# Patient Record
Sex: Male | Born: 1985 | Race: Black or African American | Hispanic: No | Smoking: Never smoker
Health system: Southern US, Community
[De-identification: ages and names within clinical notes are randomized; demographics above are authoritative.]

## PROBLEM LIST (undated history)

## (undated) DIAGNOSIS — J45909 Unspecified asthma, uncomplicated: Secondary | ICD-10-CM

## (undated) DIAGNOSIS — T7840XA Allergy, unspecified, initial encounter: Secondary | ICD-10-CM

## (undated) HISTORY — PX: HERNIA REPAIR: SHX51

## (undated) HISTORY — PX: EYE SURGERY: SHX253

## (undated) HISTORY — DX: Allergy, unspecified, initial encounter: T78.40XA

---
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
## 2003-09-21 ENCOUNTER — Emergency Department (HOSPITAL_COMMUNITY): Admission: EM | Admit: 2003-09-21 | Discharge: 2003-09-21 | Payer: Self-pay | Admitting: Emergency Medicine

## 2003-12-01 ENCOUNTER — Emergency Department (HOSPITAL_COMMUNITY): Admission: EM | Admit: 2003-12-01 | Discharge: 2003-12-01 | Payer: Self-pay

## 2005-12-13 ENCOUNTER — Emergency Department (HOSPITAL_COMMUNITY): Admission: EM | Admit: 2005-12-13 | Discharge: 2005-12-13 | Payer: Self-pay | Admitting: Emergency Medicine

## 2005-12-14 ENCOUNTER — Emergency Department (HOSPITAL_COMMUNITY): Admission: EM | Admit: 2005-12-14 | Discharge: 2005-12-14 | Payer: Self-pay | Admitting: Emergency Medicine

## 2009-12-15 ENCOUNTER — Emergency Department (HOSPITAL_COMMUNITY): Admission: EM | Admit: 2009-12-15 | Discharge: 2009-12-15 | Payer: Self-pay | Admitting: Family Medicine

## 2010-10-16 ENCOUNTER — Emergency Department (HOSPITAL_COMMUNITY)
Admission: EM | Admit: 2010-10-16 | Discharge: 2010-10-16 | Disposition: A | Discharge status: Home / Self Care | Payer: Self-pay | Attending: Emergency Medicine | Admitting: Emergency Medicine

## 2010-10-16 DIAGNOSIS — R112 Nausea with vomiting, unspecified: Secondary | ICD-10-CM | POA: Insufficient documentation

## 2010-10-16 DIAGNOSIS — R109 Unspecified abdominal pain: Secondary | ICD-10-CM | POA: Insufficient documentation

## 2010-10-16 DIAGNOSIS — R197 Diarrhea, unspecified: Secondary | ICD-10-CM | POA: Insufficient documentation

## 2010-10-16 DIAGNOSIS — R509 Fever, unspecified: Secondary | ICD-10-CM | POA: Insufficient documentation

## 2010-10-16 DIAGNOSIS — R61 Generalized hyperhidrosis: Secondary | ICD-10-CM | POA: Insufficient documentation

## 2010-11-18 LAB — GC/CHLAMYDIA PROBE AMP, GENITAL: Chlamydia, DNA Probe: NEGATIVE

## 2010-12-28 ENCOUNTER — Emergency Department (HOSPITAL_COMMUNITY)
Admission: EM | Admit: 2010-12-28 | Discharge: 2010-12-28 | Disposition: A | Discharge status: Home / Self Care | Payer: Self-pay | Attending: Emergency Medicine | Admitting: Emergency Medicine

## 2010-12-28 ENCOUNTER — Emergency Department (HOSPITAL_COMMUNITY): Payer: Self-pay

## 2010-12-28 DIAGNOSIS — M25559 Pain in unspecified hip: Secondary | ICD-10-CM | POA: Insufficient documentation

## 2010-12-28 DIAGNOSIS — M545 Low back pain, unspecified: Secondary | ICD-10-CM | POA: Insufficient documentation

## 2010-12-31 ENCOUNTER — Emergency Department (HOSPITAL_COMMUNITY)
Admission: EM | Admit: 2010-12-31 | Discharge: 2010-12-31 | Disposition: A | Discharge status: Home / Self Care | Payer: Self-pay | Attending: Emergency Medicine | Admitting: Emergency Medicine

## 2010-12-31 DIAGNOSIS — IMO0001 Reserved for inherently not codable concepts without codable children: Secondary | ICD-10-CM | POA: Insufficient documentation

## 2010-12-31 DIAGNOSIS — Z79899 Other long term (current) drug therapy: Secondary | ICD-10-CM | POA: Insufficient documentation

## 2010-12-31 DIAGNOSIS — M25559 Pain in unspecified hip: Secondary | ICD-10-CM | POA: Insufficient documentation

## 2010-12-31 DIAGNOSIS — M545 Low back pain, unspecified: Secondary | ICD-10-CM | POA: Insufficient documentation

## 2012-09-29 ENCOUNTER — Encounter (HOSPITAL_COMMUNITY): Payer: Self-pay | Admitting: *Deleted

## 2012-09-29 ENCOUNTER — Emergency Department (HOSPITAL_COMMUNITY)
Admission: EM | Admit: 2012-09-29 | Discharge: 2012-09-29 | Disposition: A | Discharge status: Home / Self Care | Payer: Self-pay | Attending: Emergency Medicine | Admitting: Emergency Medicine

## 2012-09-29 DIAGNOSIS — F1210 Cannabis abuse, uncomplicated: Secondary | ICD-10-CM | POA: Insufficient documentation

## 2012-09-29 DIAGNOSIS — J45909 Unspecified asthma, uncomplicated: Secondary | ICD-10-CM | POA: Insufficient documentation

## 2012-09-29 DIAGNOSIS — IMO0001 Reserved for inherently not codable concepts without codable children: Secondary | ICD-10-CM

## 2012-09-29 DIAGNOSIS — L03019 Cellulitis of unspecified finger: Secondary | ICD-10-CM | POA: Insufficient documentation

## 2012-09-29 HISTORY — DX: Unspecified asthma, uncomplicated: J45.909

## 2012-09-29 NOTE — ED Notes (Signed)
Pt reports swelling and tenderness in left ring finger at nail base. No known injury. No open sore to finger.

## 2012-09-29 NOTE — ED Provider Notes (Signed)
History     CSN: [id number]  Arrival date & time 09/29/12  7846   First MD Initiated Contact with Patient 09/29/12 [phone]      Chief Complaint  Patient presents with  . Finger Injury    (Consider location/radiation/quality/duration/timing/severity/associated sxs/prior treatment) Patient is a 27 y.o. male presenting with hand pain. The history is provided by the patient.  Hand Pain This is a new problem. The current episode started in the past 7 days. The problem occurs constantly. The problem has been gradually improving. Pertinent negatives include no abdominal pain, arthralgias, chest pain, chills, congestion, coughing, fatigue, fever, headaches, nausea, rash, vomiting or weakness. Exacerbated by: palpation. He has tried nothing for the symptoms. The treatment provided no relief.    Past Medical History  Diagnosis Date  . Asthma     Past Surgical History  Procedure Date  . Eye surgery     No family history on file.  History  Substance Use Topics  . Smoking status: Never Smoker   . Smokeless tobacco: Not on file  . Alcohol Use: Yes      Review of Systems  Constitutional: Negative for fever, chills and fatigue.  HENT: Negative for congestion, rhinorrhea and postnasal drip.   Eyes: Negative for photophobia and visual disturbance.  Respiratory: Negative for cough, chest tightness, shortness of breath and wheezing.   Cardiovascular: Negative for chest pain, palpitations and leg swelling.  Gastrointestinal: Negative for nausea, vomiting, abdominal pain and diarrhea.  Genitourinary: Negative for urgency, frequency and difficulty urinating.  Musculoskeletal: Negative for back pain and arthralgias.       Finger pain  Skin: Negative for rash and wound.  Neurological: Negative for weakness and headaches.  Psychiatric/Behavioral: Negative for confusion and agitation.    Allergies  Review of patient's allergies indicates no known allergies.  Home Medications   Current  Outpatient Rx  Name  Route  Sig  Dispense  Refill  . BENADRYL PO   Oral   Take 15 mLs by mouth 4 (four) times daily as needed. allergies         . IBUPROFEN 200 MG PO TABS   Oral   Take 400 mg by mouth every 6 (six) hours as needed. For pain           BP 133/80  Pulse 105  Temp 99.3 F (37.4 C) (Oral)  Resp 16  SpO2 99%  Physical Exam  Nursing note and vitals reviewed. Constitutional: He is oriented to person, place, and time. He appears well-developed and well-nourished. No distress.  HENT:  Head: Normocephalic and atraumatic.  Mouth/Throat: Oropharynx is clear and moist.  Eyes: EOM are normal. Pupils are equal, round, and reactive to light.  Neck: Normal range of motion. Neck supple.  Cardiovascular: Normal rate, regular rhythm, normal heart sounds and intact distal pulses.   Pulmonary/Chest: Effort normal and breath sounds normal. He has no wheezes. He has no rales.  Abdominal: Soft. Bowel sounds are normal. He exhibits no distension. There is no tenderness. There is no rebound and no guarding.  Musculoskeletal: Normal range of motion. He exhibits edema. He exhibits no tenderness.       Paronychia to left 4th finger. No pain with flexion or extension of finger. No sausage digit.  Lymphadenopathy:    He has no cervical adenopathy.  Neurological: He is alert and oriented to person, place, and time. He displays normal reflexes. No cranial nerve deficit. He exhibits normal muscle tone. Coordination normal.  Skin: Skin is  warm and dry. No rash noted.  Psychiatric: He has a normal mood and affect. His behavior is normal.    ED Course  Drain paronychia Date/Time: 09/29/2012 9:35 AM Performed by: Johnnette Gourd Authorized by: Lorre Nick T Consent: Verbal consent obtained. Patient understanding: patient states understanding of the procedure being performed Patient consent: the patient's understanding of the procedure matches consent given Site marked: the operative site  was marked Patient identity confirmed: verbally with patient Preparation: Patient was prepped and draped in the usual sterile fashion. Local anesthesia used: no Patient sedated: no Patient tolerance: Patient tolerated the procedure well with no immediate complications. Comments: Small amount of purulent fluid drained.   (including critical care time)  Labs Reviewed - No data to display No results found.   1. Paronychia of fourth finger of left hand       MDM  42M with left 4th finger paronychia he noticed a couple days ago. No injury. No fevers or signs of systemic illness. No concern for flexor tenosynovitis, osteomyelitis, of other deep tissue infection. Paronychia drained without complications. Stable for d/c home. Given return precautions.        Johnnette Gourd, MD 09/29/12 1101

## 2012-10-01 NOTE — ED Provider Notes (Signed)
I saw and evaluated the patient, reviewed the resident's note and I agree with the findings and plan.  Toy Baker, MD 10/01/12 1101

## 2012-10-02 ENCOUNTER — Emergency Department (HOSPITAL_COMMUNITY)
Admission: EM | Admit: 2012-10-02 | Discharge: 2012-10-02 | Disposition: A | Discharge status: Home / Self Care | Payer: Self-pay | Attending: Emergency Medicine | Admitting: Emergency Medicine

## 2012-10-02 ENCOUNTER — Encounter (HOSPITAL_COMMUNITY): Payer: Self-pay | Admitting: Physical Medicine and Rehabilitation

## 2012-10-02 DIAGNOSIS — R5381 Other malaise: Secondary | ICD-10-CM | POA: Insufficient documentation

## 2012-10-02 DIAGNOSIS — R112 Nausea with vomiting, unspecified: Secondary | ICD-10-CM | POA: Insufficient documentation

## 2012-10-02 DIAGNOSIS — R1110 Vomiting, unspecified: Secondary | ICD-10-CM

## 2012-10-02 DIAGNOSIS — R5383 Other fatigue: Secondary | ICD-10-CM | POA: Insufficient documentation

## 2012-10-02 DIAGNOSIS — J45909 Unspecified asthma, uncomplicated: Secondary | ICD-10-CM | POA: Insufficient documentation

## 2012-10-02 DIAGNOSIS — F1010 Alcohol abuse, uncomplicated: Secondary | ICD-10-CM | POA: Insufficient documentation

## 2012-10-02 LAB — URINALYSIS, ROUTINE W REFLEX MICROSCOPIC
Bilirubin Urine: NEGATIVE
Glucose, UA: NEGATIVE mg/dL
Hgb urine dipstick: NEGATIVE
Ketones, ur: NEGATIVE mg/dL
Leukocytes, UA: NEGATIVE
Nitrite: NEGATIVE
Protein, ur: NEGATIVE mg/dL
Specific Gravity, Urine: 1.023 (ref 1.005–1.030)
Urobilinogen, UA: 1 mg/dL (ref 0.0–1.0)
pH: 7.5 (ref 5.0–8.0)

## 2012-10-02 MED ORDER — ONDANSETRON 4 MG PO TBDP
4.0000 mg | ORAL_TABLET | Freq: Once | ORAL | Status: AC
  Administered 2012-10-02: 4 mg via ORAL
  Filled 2012-10-02: qty 1

## 2012-10-02 NOTE — ED Notes (Signed)
Pt able to keep fluids down; pt denies nausea/vomiting. Pt denies pain. Pt alert and mentating appropriately.

## 2012-10-02 NOTE — ED Notes (Signed)
Pt presents to department for evaluation of nausea and vomiting. Onset this morning. States he woke up and began vomiting. Denies abdominal pain. Admits to drinking ETOH last night. Also states generalized weakness. Denies fever. He is alert and oriented x4.

## 2012-10-02 NOTE — ED Notes (Signed)
Pt states he feels weak all over; pt denies pain; pt states he thinks there is blood in his vomit; pt states he has been unable to keep fluids down; pt denies numbness/tingling; pt denies headache; pt alert and mentating appropriately.

## 2012-10-02 NOTE — ED Notes (Addendum)
Family at bedside. 

## 2012-10-02 NOTE — ED Notes (Signed)
Pt given water and ginger ale in attempt to fluid challenge per B. Reola Calkins, PA order

## 2012-10-02 NOTE — ED Notes (Signed)
Pt ambulatory leaving ED with d/c paperwork. Pt given d/c teaching. Pt educated on need for follow-up care and shown resource guide to how to find PCP. Pt verbalized understanding of d/c teaching. Pt has no further questions upon d/c. Pt does not show signs of acute distress upon d/c.

## 2012-10-03 NOTE — ED Provider Notes (Signed)
History     CSN: [id number]  Arrival date & time 10/02/12  1148   First MD Initiated Contact with Patient 10/02/12 1336      Chief Complaint  Patient presents with  . Nausea  . Emesis    (Consider location/radiation/quality/duration/timing/severity/associated sxs/prior treatment) HPI Comments: 1/ y.o. male presents today complaining of acute onset nausea and vomiting x5 times since this morning. Pt woke up and vomited shortly there after (food contents, not sure if he saw blood or not, no bilie). Pt states vomiting was severe and concerning to him. Pt took no interventions. Nothing makes it better or worse. Pt admits ETOH intake last night (vodka, two drinks, and eating chicken wings at a restaurant). Denies abdominal pain, diarrhea, chest pain, palpitations, shortness of breath, generalized weakness, numbness, tingling, headache, visual disturbances. PT A&O x4, No significant PMHx,      Patient is a 27 y.o. male presenting with vomiting.  Emesis  Pertinent negatives include no diarrhea, no fever and no headaches.    Past Medical History  Diagnosis Date  . Asthma     Past Surgical History  Procedure Date  . Eye surgery     History reviewed. No pertinent family history.  History  Substance Use Topics  . Smoking status: Never Smoker   . Smokeless tobacco: Not on file  . Alcohol Use: Yes      Review of Systems  Constitutional: Negative for fever and diaphoresis.  HENT: Negative for neck pain and neck stiffness.   Eyes: Negative for visual disturbance.  Respiratory: Negative for apnea, chest tightness and shortness of breath.   Cardiovascular: Negative for chest pain and palpitations.  Gastrointestinal: Positive for nausea and vomiting. Negative for diarrhea and constipation.  Genitourinary: Negative for dysuria.  Musculoskeletal: Negative for gait problem.  Skin: Negative for rash.  Neurological: Negative for dizziness, weakness, light-headedness, numbness and  headaches.    Allergies  Review of patient's allergies indicates no known allergies.  Home Medications   Current Outpatient Rx  Name  Route  Sig  Dispense  Refill  . BENADRYL PO   Oral   Take 15 mLs by mouth 4 (four) times daily as needed. allergies         . IBUPROFEN 200 MG PO TABS   Oral   Take 400 mg by mouth every 6 (six) hours as needed. For pain           BP 119/74  Pulse 66  Temp 98.3 F (36.8 C) (Oral)  Resp 16  SpO2 97%  Physical Exam  Nursing note and vitals reviewed. Constitutional: He is oriented to person, place, and time. He appears well-developed and well-nourished. No distress.  HENT:  Head: Normocephalic and atraumatic.  Eyes: EOM are normal. Pupils are equal, round, and reactive to light.  Neck: Normal range of motion. Neck supple.       No meningeal signs  Cardiovascular: Normal rate, regular rhythm and normal heart sounds.  Exam reveals no gallop and no friction rub.   No murmur heard. Pulmonary/Chest: Effort normal and breath sounds normal. No respiratory distress. He has no wheezes. He has no rales. He exhibits no tenderness.  Abdominal: Soft. Bowel sounds are normal. He exhibits no distension. There is no tenderness. There is no rebound and no guarding.  Musculoskeletal: Normal range of motion. He exhibits no edema and no tenderness.       Good grip strength. Muscle strength 5/5 throughout.   Neurological: He is alert and oriented  to person, place, and time. No cranial nerve deficit.       Sensation to light touch intact. No focal deficits. Point to point coordination in tact.   Skin: Skin is warm and dry. He is not diaphoretic. No erythema.    ED Course  Procedures (including critical care time)   Labs Reviewed  URINALYSIS, ROUTINE W REFLEX MICROSCOPIC  LAB REPORT - SCANNED   No results found.  Results for orders placed during the hospital encounter of 10/02/12  URINALYSIS, ROUTINE W REFLEX MICROSCOPIC      Component Value Range    Color, Urine YELLOW  YELLOW   APPearance CLEAR  CLEAR   Specific Gravity, Urine 1.023  1.005 - 1.030   pH 7.5  5.0 - 8.0   Glucose, UA NEGATIVE  NEGATIVE mg/dL   Hgb urine dipstick NEGATIVE  NEGATIVE   Bilirubin Urine NEGATIVE  NEGATIVE   Ketones, ur NEGATIVE  NEGATIVE mg/dL   Protein, ur NEGATIVE  NEGATIVE mg/dL   Urobilinogen, UA 1.0  0.0 - 1.0 mg/dL   Nitrite NEGATIVE  NEGATIVE   Leukocytes, UA NEGATIVE  NEGATIVE   1. Vomiting       MDM  Pt well-appearing and non-emetic during exam. Alert and answering questions appropriately. Family member at bedside. Treated nausea with Zofran. Will monitor vitals, fluid challenge, and re-assess. Urinalysis unremarkable.  Successfully completed fluid challenge. No muscular/neuro deficits on physical exam. Vitals stable.  At this time there does not appear to be any evidence of an acute emergency medical condition and the patient appears stable for discharge with appropriate outpatient follow up. Discussed importance of establishing relationship with PCP, the availability of [facility] facility, and provided resource guide. Diagnosis was discussed with patient who verbalizes understanding and is agreeable to discharge. Pt also seen by Dr. Oletta Lamas who agrees with the plan.  Glade Nurse, PA-C 10/03/12 1106

## 2012-10-05 NOTE — ED Provider Notes (Signed)
Medical screening examination/treatment/procedure(s) were conducted as a shared visit with non-physician practitioner(s) and myself.  I personally evaluated the patient during the encounter  Pt with soft abdomen, improved after antiemetics.  Not dehydrated.  Likely self limiting gastroenteritis like illness.     Gavin Pound. Ashtian Villacis, MD 10/05/12 1356

## 2013-05-04 ENCOUNTER — Emergency Department (HOSPITAL_COMMUNITY)
Admission: EM | Admit: 2013-05-04 | Discharge: 2013-05-04 | Disposition: A | Discharge status: Home / Self Care | Payer: Self-pay | Attending: Emergency Medicine | Admitting: Emergency Medicine

## 2013-05-04 ENCOUNTER — Encounter (HOSPITAL_COMMUNITY): Payer: Self-pay | Admitting: Emergency Medicine

## 2013-05-04 DIAGNOSIS — K029 Dental caries, unspecified: Secondary | ICD-10-CM | POA: Insufficient documentation

## 2013-05-04 DIAGNOSIS — J45909 Unspecified asthma, uncomplicated: Secondary | ICD-10-CM | POA: Insufficient documentation

## 2013-05-04 MED ORDER — IBUPROFEN 800 MG PO TABS: 800.0000 mg | ORAL_TABLET | Freq: Three times a day (TID) | ORAL | Status: DC | PRN

## 2013-05-04 MED ORDER — HYDROCODONE-ACETAMINOPHEN 5-325 MG PO TABS: 1.0000 | ORAL_TABLET | Freq: Four times a day (QID) | ORAL | Status: DC | PRN

## 2013-05-04 NOTE — ED Provider Notes (Signed)
CSN: [id number]     Arrival date & time 05/04/13  3086 History   First MD Initiated Contact with Patient 05/04/13 [phone]     Chief Complaint  Patient presents with  . Dental Pain   (Consider location/radiation/quality/duration/timing/severity/associated sxs/prior Treatment) HPI Patient presents emergency department with dental pain that began last Friday.  Patient, states, that he has a crack, and a chip in that tooth in the left lower back tooth.  Patient, states, that he has not have any swelling or pain in the gumline.  The patient denies shortness of breath, difficulty swallowing, difficulty breathing, neck pain, neck swelling, fever, or hoarseness.  Patient, states he did not take any medications prior to arrival.  Patient, states, that nothing seems to make his condition, better, but palpation and air, make this tooth pain worse. Past Medical History  Diagnosis Date  . Asthma    Past Surgical History  Procedure Laterality Date  . Eye surgery     No family history on file. History  Substance Use Topics  . Smoking status: Never Smoker   . Smokeless tobacco: Not on file  . Alcohol Use: Yes    Review of Systems All other systems negative except as documented in the HPI. All pertinent positives and negatives as reviewed in the HPI. Allergies  Review of patient's allergies indicates no known allergies.  Home Medications   Current Outpatient Rx  Name  Route  Sig  Dispense  Refill  . acetaminophen (TYLENOL) 500 MG tablet   Oral   Take 1,000 mg by mouth every 6 (six) hours as needed for pain (pain).         Marland Kitchen amoxicillin (AMOXIL) 500 MG tablet   Oral   Take 500 mg by mouth 2 (two) times daily. Pt started on 05-04-13. This rx was from a friend's old rx. Pt has only taken 1 dose.         . ibuprofen (ADVIL,MOTRIN) 200 MG tablet   Oral   Take 400 mg by mouth every 6 (six) hours as needed. For pain          BP 109/60  Pulse 60  Temp(Src) 97.3 F (36.3 C) (Oral)  Resp 16   SpO2 97% Physical Exam  Constitutional: He appears well-developed and well-nourished. No distress.  HENT:  Head: Normocephalic and atraumatic.  Mouth/Throat: Uvula is midline, oropharynx is clear and moist and mucous membranes are normal. Abnormal dentition. Dental caries present. No dental abscesses or edematous.    Eyes: Pupils are equal, round, and reactive to light.  Cardiovascular: Normal rate, regular rhythm and normal heart sounds.  Exam reveals no gallop and no friction rub.   No murmur heard. Pulmonary/Chest: Effort normal and breath sounds normal. No respiratory distress.    ED Course  Procedures (including critical care time)   The patient will be referred to dentistry. Told to return here as needed.    Carlyle Dolly, PA-C 05/04/13 1019

## 2013-05-04 NOTE — ED Provider Notes (Signed)
Medical screening examination/treatment/procedure(s) were performed by non-physician practitioner and as supervising physician I was immediately available for consultation/collaboration.   Dagmar Hait, MD 05/04/13 [phone]

## 2013-05-04 NOTE — ED Notes (Signed)
Pt c/o left back lower tooth pain that started hurting over the weekend.

## 2014-02-16 ENCOUNTER — Encounter (HOSPITAL_COMMUNITY): Payer: Self-pay | Admitting: Emergency Medicine

## 2014-02-16 ENCOUNTER — Emergency Department (HOSPITAL_COMMUNITY)
Admission: EM | Admit: 2014-02-16 | Discharge: 2014-02-16 | Disposition: A | Discharge status: Home / Self Care | Payer: Self-pay | Attending: Emergency Medicine | Admitting: Emergency Medicine

## 2014-02-16 DIAGNOSIS — Z79899 Other long term (current) drug therapy: Secondary | ICD-10-CM | POA: Insufficient documentation

## 2014-02-16 DIAGNOSIS — IMO0002 Reserved for concepts with insufficient information to code with codable children: Secondary | ICD-10-CM | POA: Insufficient documentation

## 2014-02-16 DIAGNOSIS — Z792 Long term (current) use of antibiotics: Secondary | ICD-10-CM | POA: Insufficient documentation

## 2014-02-16 DIAGNOSIS — J45909 Unspecified asthma, uncomplicated: Secondary | ICD-10-CM | POA: Insufficient documentation

## 2014-02-16 DIAGNOSIS — S46909A Unspecified injury of unspecified muscle, fascia and tendon at shoulder and upper arm level, unspecified arm, initial encounter: Secondary | ICD-10-CM | POA: Insufficient documentation

## 2014-02-16 DIAGNOSIS — X500XXA Overexertion from strenuous movement or load, initial encounter: Secondary | ICD-10-CM | POA: Insufficient documentation

## 2014-02-16 DIAGNOSIS — S4980XA Other specified injuries of shoulder and upper arm, unspecified arm, initial encounter: Secondary | ICD-10-CM | POA: Insufficient documentation

## 2014-02-16 DIAGNOSIS — S39012A Strain of muscle, fascia and tendon of lower back, initial encounter: Secondary | ICD-10-CM

## 2014-02-16 DIAGNOSIS — Y929 Unspecified place or not applicable: Secondary | ICD-10-CM | POA: Insufficient documentation

## 2014-02-16 DIAGNOSIS — T148XXA Other injury of unspecified body region, initial encounter: Secondary | ICD-10-CM

## 2014-02-16 DIAGNOSIS — Y9389 Activity, other specified: Secondary | ICD-10-CM | POA: Insufficient documentation

## 2014-02-16 MED ORDER — CYCLOBENZAPRINE HCL 10 MG PO TABS: 10.0000 mg | ORAL_TABLET | Freq: Two times a day (BID) | ORAL | Status: DC | PRN

## 2014-02-16 NOTE — Discharge Instructions (Signed)
No driving or operating heavy machinery while taking flexeril. This medication may make you drowsy. Continue with ibuprofen, 600-800 mg every 6-8 hours respectively. Rest, avoid heavy lifting or hard physical activity. Apply ice alternated with a heating pack intermittently, 15 minutes at a time.  Back Pain, Adult Low back pain is very common. About 1 in 5 people have back pain.The cause of low back pain is rarely dangerous. The pain often gets better over time.About half of people with a sudden onset of back pain feel better in just 2 weeks. About 8 in 10 people feel better by 6 weeks.  CAUSES Some common causes of back pain include:  Strain of the muscles or ligaments supporting the spine.  Wear and tear (degeneration) of the spinal discs.  Arthritis.  Direct injury to the back. DIAGNOSIS Most of the time, the direct cause of low back pain is not known.However, back pain can be treated effectively even when the exact cause of the pain is unknown.Answering your caregiver's questions about your overall health and symptoms is one of the most accurate ways to make sure the cause of your pain is not dangerous. If your caregiver needs more information, he or she may order lab work or imaging tests (X-rays or MRIs).However, even if imaging tests show changes in your back, this usually does not require surgery. HOME CARE INSTRUCTIONS For many people, back pain returns.Since low back pain is rarely dangerous, it is often a condition that people can learn to [facility] Hospitalmanageon their own.   Remain active. It is stressful on the back to sit or stand in one place. Do not sit, drive, or stand in one place for more than 30 minutes at a time. Take short walks on level surfaces as soon as pain allows.Try to increase the length of time you walk each day.  Do not stay in bed.Resting more than 1 or 2 days can delay your recovery.  Do not avoid exercise or work.Your body is made to move.It is not dangerous to  be active, even though your back may hurt.Your back will likely heal faster if you return to being active before your pain is gone.  Pay attention to your body when you bend and lift. Many people have less discomfortwhen lifting if they bend their knees, keep the load close to their bodies,and avoid twisting. Often, the most comfortable positions are those that put less stress on your recovering back.  Find a comfortable position to sleep. Use a firm mattress and lie on your side with your knees slightly bent. If you lie on your back, put a pillow under your knees.  Only take over-the-counter or prescription medicines as directed by your caregiver. Over-the-counter medicines to reduce pain and inflammation are often the most helpful.Your caregiver may prescribe muscle relaxant drugs.These medicines help dull your pain so you can more quickly return to your normal activities and healthy exercise.  Put ice on the injured area.  Put ice in a plastic bag.  Place a towel between your skin and the bag.  Leave the ice on for 15-20 minutes, 03-04 times a day for the first 2 to 3 days. After that, ice and heat may be alternated to reduce pain and spasms.  Ask your caregiver about trying back exercises and gentle massage. This may be of some benefit.  Avoid feeling anxious or stressed.Stress increases muscle tension and can worsen back pain.It is important to recognize when you are anxious or stressed and learn ways to  manage it.Exercise is a great option. SEEK MEDICAL CARE IF:  You have pain that is not relieved with rest or medicine.  You have pain that does not improve in 1 week.  You have new symptoms.  You are generally not feeling well. SEEK IMMEDIATE MEDICAL CARE IF:   You have pain that radiates from your back into your legs.  You develop new bowel or bladder control problems.  You have unusual weakness or numbness in your arms or legs.  You develop nausea or  vomiting.  You develop abdominal pain.  You feel faint. Document Released: 08/17/2005 Document Revised: 02/16/2012 Document Reviewed: 01/05/2011 [facility] Patient Information 2015 [place], [place]. This information is not intended to replace advice given to you by your health care provider. Make sure you discuss any questions you have with your health care provider.  Muscle Strain A muscle strain is an injury that occurs when a muscle is stretched beyond its normal length. Usually a small number of muscle fibers are torn when this happens. Muscle strain is rated in degrees. First-degree strains have the least amount of muscle fiber tearing and pain. Second-degree and third-degree strains have increasingly more tearing and pain.  Usually, recovery from muscle strain takes 1-2 weeks. Complete healing takes 5-6 weeks.  CAUSES  Muscle strain happens when a sudden, violent force placed on a muscle stretches it too far. This may occur with lifting, sports, or a fall.  RISK FACTORS Muscle strain is especially common in athletes.  SIGNS AND SYMPTOMS At the site of the muscle strain, there may be:  Pain.  Bruising.  Swelling.  Difficulty using the muscle due to pain or lack of normal function. DIAGNOSIS  Your health care provider will perform a physical exam and ask about your medical history. TREATMENT  Often, the best treatment for a muscle strain is resting, icing, and applying cold compresses to the injured area.  HOME CARE INSTRUCTIONS   Use the PRICE method of treatment to promote muscle healing during the first 2-3 days after your injury. The PRICE method involves:  Protecting the muscle from being injured again.  Restricting your activity and resting the injured body part.  Icing your injury. To do this, put ice in a plastic bag. Place a towel between your skin and the bag. Then, apply the ice and leave it on from 15-20 minutes each hour. After the third day, switch to moist heat  packs.  Apply compression to the injured area with a splint or elastic bandage. Be careful not to wrap it too tightly. This may interfere with blood circulation or increase swelling.  Elevate the injured body part above the level of your heart as often as you can.  Only take over-the-counter or prescription medicines for pain, discomfort, or fever as directed by your health care provider.  Warming up prior to exercise helps to prevent future muscle strains. SEEK MEDICAL CARE IF:   You have increasing pain or swelling in the injured area.  You have numbness, tingling, or a significant loss of strength in the injured area. MAKE SURE YOU:   Understand these instructions.  Will watch your condition.  Will get help right away if you are not doing well or get worse. Document Released: 08/17/2005 Document Revised: 06/07/2013 Document Reviewed: 03/16/2013 [facility] Patient Information 2015 [place], [place]. This information is not intended to replace advice given to you by your health care provider. Make sure you discuss any questions you have with your health care provider.

## 2014-02-16 NOTE — ED Notes (Signed)
Pt c/o right upper back pain started Saturday when lifting chair at work. Patient has tried heating packs, ibuprofen and aspirin with minimal relief. Patient sts pain has increased since Saturday. Patient has full ROM in shoulder and with back, non-tender to palpation. Patient in NAD- ambulatory.
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
## 2014-02-21 NOTE — ED Provider Notes (Signed)
Medical screening examination/treatment/procedure(s) were performed by non-physician practitioner and as supervising physician I was immediately available for consultation/collaboration.   EKG Interpretation None        Mark James, MD 02/21/14 0829 

## 2014-06-10 ENCOUNTER — Encounter (HOSPITAL_COMMUNITY): Payer: Self-pay | Admitting: Emergency Medicine

## 2014-06-10 ENCOUNTER — Emergency Department (HOSPITAL_COMMUNITY)
Admission: EM | Admit: 2014-06-10 | Discharge: 2014-06-10 | Disposition: A | Discharge status: Home / Self Care | Payer: Self-pay | Attending: Emergency Medicine | Admitting: Emergency Medicine

## 2014-06-10 ENCOUNTER — Emergency Department (HOSPITAL_COMMUNITY): Payer: Self-pay

## 2014-06-10 DIAGNOSIS — Y92410 Unspecified street and highway as the place of occurrence of the external cause: Secondary | ICD-10-CM | POA: Insufficient documentation

## 2014-06-10 DIAGNOSIS — Z79899 Other long term (current) drug therapy: Secondary | ICD-10-CM | POA: Insufficient documentation

## 2014-06-10 DIAGNOSIS — Z792 Long term (current) use of antibiotics: Secondary | ICD-10-CM | POA: Insufficient documentation

## 2014-06-10 DIAGNOSIS — J45909 Unspecified asthma, uncomplicated: Secondary | ICD-10-CM | POA: Insufficient documentation

## 2014-06-10 DIAGNOSIS — Y9389 Activity, other specified: Secondary | ICD-10-CM | POA: Insufficient documentation

## 2014-06-10 DIAGNOSIS — S8002XA Contusion of left knee, initial encounter: Secondary | ICD-10-CM | POA: Insufficient documentation

## 2014-06-10 MED ORDER — HYDROCODONE-ACETAMINOPHEN 5-325 MG PO TABS: 1.0000 | ORAL_TABLET | Freq: Four times a day (QID) | ORAL | Status: DC | PRN

## 2014-06-10 MED ORDER — IBUPROFEN 800 MG PO TABS: 800.0000 mg | ORAL_TABLET | Freq: Three times a day (TID) | ORAL | Status: DC | PRN

## 2014-06-10 NOTE — Discharge Instructions (Signed)
Return here as needed. Follow up with an urgent care or primary doctor. Ice and heat to the areas that are sore.
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
## 2014-06-13 NOTE — ED Provider Notes (Signed)
Medical screening examination/treatment/procedure(s) were performed by non-physician practitioner and as supervising physician I was immediately available for consultation/collaboration.   EKG Interpretation None       Raeford RazorStephen Keola Heninger, MD 06/13/14 [phone]

## 2017-08-11 ENCOUNTER — Emergency Department (HOSPITAL_COMMUNITY): Payer: 59

## 2017-08-11 ENCOUNTER — Encounter (HOSPITAL_COMMUNITY): Payer: Self-pay | Admitting: Emergency Medicine

## 2017-08-11 ENCOUNTER — Other Ambulatory Visit: Payer: Self-pay

## 2017-08-11 ENCOUNTER — Emergency Department (HOSPITAL_COMMUNITY)
Admission: EM | Admit: 2017-08-11 | Discharge: 2017-08-11 | Disposition: A | Discharge status: Home / Self Care | Payer: 59 | Attending: Emergency Medicine | Admitting: Emergency Medicine

## 2017-08-11 DIAGNOSIS — Y999 Unspecified external cause status: Secondary | ICD-10-CM | POA: Diagnosis not present

## 2017-08-11 DIAGNOSIS — W51XXXA Accidental striking against or bumped into by another person, initial encounter: Secondary | ICD-10-CM | POA: Insufficient documentation

## 2017-08-11 DIAGNOSIS — S4991XA Unspecified injury of right shoulder and upper arm, initial encounter: Secondary | ICD-10-CM | POA: Diagnosis present

## 2017-08-11 DIAGNOSIS — M79621 Pain in right upper arm: Secondary | ICD-10-CM | POA: Diagnosis not present

## 2017-08-11 DIAGNOSIS — M79601 Pain in right arm: Secondary | ICD-10-CM

## 2017-08-11 DIAGNOSIS — Y9289 Other specified places as the place of occurrence of the external cause: Secondary | ICD-10-CM | POA: Insufficient documentation

## 2017-08-11 DIAGNOSIS — Y9389 Activity, other specified: Secondary | ICD-10-CM | POA: Insufficient documentation

## 2017-08-11 NOTE — Discharge Instructions (Signed)
Please read attached information. If you experience any new or worsening signs or symptoms please return to the emergency room for evaluation. Please follow-up with your primary care provider or specialist as discussed.  °
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
## 2017-08-11 NOTE — ED Notes (Signed)
Patient transported to X-ray 
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
## 2017-08-30 ENCOUNTER — Encounter: Payer: Self-pay | Admitting: Sports Medicine

## 2017-08-30 ENCOUNTER — Ambulatory Visit (INDEPENDENT_AMBULATORY_CARE_PROVIDER_SITE_OTHER): Payer: 59 | Admitting: Sports Medicine

## 2017-08-30 VITALS — BP 116/74 | Ht 70.0 in | Wt 162.0 lb

## 2017-08-30 DIAGNOSIS — M79631 Pain in right forearm: Secondary | ICD-10-CM
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
## 2017-09-01 ENCOUNTER — Encounter: Payer: Self-pay | Admitting: *Deleted

## 2017-09-06 ENCOUNTER — Encounter: Payer: Self-pay | Admitting: *Deleted

## 2017-09-14 ENCOUNTER — Other Ambulatory Visit: Payer: Self-pay | Admitting: Student

## 2017-09-14 DIAGNOSIS — K625 Hemorrhage of anus and rectum: Secondary | ICD-10-CM

## 2017-09-17 ENCOUNTER — Ambulatory Visit
Admission: RE | Admit: 2017-09-17 | Discharge: 2017-09-17 | Disposition: A | Discharge status: Home / Self Care | Payer: 59 | Source: Ambulatory Visit | Attending: Student | Admitting: Student

## 2017-09-17 DIAGNOSIS — K625 Hemorrhage of anus and rectum: Secondary | ICD-10-CM

## 2017-09-17 MED ORDER — IOPAMIDOL (ISOVUE-300) INJECTION 61%: 100.0000 mL | Freq: Once | INTRAVENOUS | Status: DC | PRN

## 2017-09-20 ENCOUNTER — Ambulatory Visit (INDEPENDENT_AMBULATORY_CARE_PROVIDER_SITE_OTHER): Payer: 59 | Admitting: Sports Medicine

## 2017-09-20 VITALS — BP 130/84 | Ht 70.0 in | Wt 160.0 lb

## 2017-09-20 DIAGNOSIS — M79631 Pain in right forearm: Secondary | ICD-10-CM

## 2017-09-20 MED ORDER — GABAPENTIN 300 MG PO CAPS: 300.0000 mg | ORAL_CAPSULE | Freq: Every day | ORAL | 1 refills | Status: DC

## 2017-09-20 NOTE — Progress Notes (Signed)
   Subjective:    Patient ID: Ruben Fritz, male    DOB: 1985-10-04, 32 y.o.   MRN: [id number]  HPI   Patient comes in today for follow-up on right forearm pain. Pain is improving but has not resolved. It is intermittent throughout the day but it is pretty constant at night. It is an achy discomfort that he localizes to the volar forearm. Previous pain in his upper arm has resolved. He continues to deny numbness or tingling. He has been doing his home exercises and thinks that that has been helpful. His job does not have light duty so he has been out of work for the past couple of weeks.  Review of Systems As above    Objective:   Physical Exam  Well-developed, well-nourished. No acute distress. Awake alert and oriented 3. Vital signs reviewed  Right forearm: Patient still tender to palpation in the volar forearm in the area of the pronator muscle. No palpable defect. No soft tissue swelling. Still with reproducible pain with resisted pronation. Negative Tinel's at the cubital tunnel. Neurovascular intact distally. Good grip.      Assessment & Plan:   Right forearm pain likely secondary to pronator strain  Patient's symptoms are improving albeit slowly. I do not think he should return to work yet. I'm going to keep him out of work for another 4 weeks. Follow-up with me at the end of that time for reevaluation. We will try some gabapentin at night. If patient's symptoms plateau or worsen then I would consider referral to one of the hand specialists. Patient will call me with questions or concerns prior to his follow-up visit.

## 2017-10-18 ENCOUNTER — Ambulatory Visit (INDEPENDENT_AMBULATORY_CARE_PROVIDER_SITE_OTHER): Payer: 59 | Admitting: Sports Medicine

## 2017-10-18 VITALS — BP 124/78 | Ht 70.0 in | Wt 160.0 lb

## 2017-10-18 DIAGNOSIS — M79631 Pain in right forearm: Secondary | ICD-10-CM | POA: Diagnosis not present

## 2017-10-18 NOTE — Patient Instructions (Signed)
Jackquline BoschJesse Chandler, MD [facility] [address], [place], [place] [address] Phone: [phone]  Friday 10/22/17 @ 1130a

## 2017-10-18 NOTE — Progress Notes (Signed)
   Subjective:    Patient ID: Ruben AuerbachDesmond D Filippini, male    DOB: November 06, 1985, 32 y.o.   MRN: [id number]  HPI Patient comes in today for follow-up on right forearm pain. He is still struggling with pain. Pain is worse with pronation. It has not improved since his last visit. He also endorses intermittent spasm in the area. Gabapentin has been of minimal benefit. He remains out of work due to his pain.     Review of Systems As above     Objective:   Physical Exam  Well-developed, well-nourished. No acute distress.   Right forearm: No soft tissue swelling. He is tender to palpation in the volar forearm in the area of the pronator muscle belly. He has reproducible pain and weakness with forearm pronation. No pain with supination. No tenderness over medial or lateral epicondyles. Good grip strength. Neurovascularly intact distally.      Assessment & Plan:   Persistent right forearm pain secondary to pronator strain  Patient has not had much improvement with relative rest. He remains out of work due to his pain. Not sure if this is a simple pronator strain or if there is something more neuropathic involved. I would like to refer the patient to Dr. Ave Filterhandler for his input. Patient will likely need more of a workup but I will defer that to Dr. Veda Canninghandler's discretion. Patient will remain out of work until his appointment with Dr. Ave Filterhandler and the patient will follow-up with me as needed.
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
## 2022-02-04 ENCOUNTER — Ambulatory Visit
Admission: EM | Admit: 2022-02-04 | Discharge: 2022-02-04 | Disposition: A | Discharge status: Home / Self Care | Payer: BC Managed Care – PPO | Attending: Emergency Medicine | Admitting: Emergency Medicine

## 2022-02-04 ENCOUNTER — Ambulatory Visit: Payer: Self-pay

## 2022-02-04 DIAGNOSIS — N6002 Solitary cyst of left breast: Secondary | ICD-10-CM | POA: Insufficient documentation

## 2022-02-04 DIAGNOSIS — Z113 Encounter for screening for infections with a predominantly sexual mode of transmission: Secondary | ICD-10-CM | POA: Diagnosis not present

## 2022-02-04 NOTE — Discharge Instructions (Signed)
Please discuss next steps with your primary care provider for evaluation and possible treatment of the cyst in your left breast.  The cyst is considered a simple cyst because it is draining clear fluid and has smooth borders.  Simple cysts carry a low risk for malignancy.  The results of your STD screening will be posted to your MyChart account once they are complete.  If there are any abnormal findings, you will be contacted by phone and treatment will be provided as needed.  Thank you for visiting urgent care today.

## 2022-02-04 NOTE — ED Triage Notes (Addendum)
Pt c/o abscess to left side of his chest beneath  his nipple, he states there was a clear discharge coming out of the nipple.   Started: 2-3 weeks ago  The patient is also requesting to be tested for STDs, he denies having any symptoms currently.

## 2022-02-04 NOTE — ED Provider Notes (Signed)
[facility]    CSN: [id number] Arrival date & time: 02/04/22  1656    HISTORY   Chief Complaint  Patient presents with   SEXUALLY TRANSMITTED DISEASE   Breast Discharge   HPI Ruben Fritz is a 36 y.o. male. Pt c/o 2-3 week hx of lesion on the left side of his chest beneath his nipple, he states there has been clear drainage coming out of the nipple as well.  Patient is also requesting to be tested for STDs, he denies having any symptoms currently.  The history is provided by the patient.  Past Medical History:  Diagnosis Date   Asthma    There are no problems to display for this patient.  Past Surgical History:  Procedure Laterality Date   EYE SURGERY      Home Medications    Prior to Admission medications   Not on File    Family History History reviewed. No pertinent family history. Social History Social History   Tobacco Use   Smoking status: Never  Substance Use Topics   Alcohol use: Yes    Alcohol/week: 3.0 standard drinks    Types: 3 Glasses of wine per week   Drug use: Yes    Types: Marijuana    Comment: occasionally   Allergies   Patient has no known allergies.  Review of Systems Review of Systems Pertinent findings noted in history of present illness.   Physical Exam Triage Vital Signs ED Triage Vitals  Enc Vitals Group     BP 06/27/21 0827 (!) 147/82     Pulse Rate 06/27/21 0827 72     Resp 06/27/21 0827 18     Temp 06/27/21 0827 98.3 F (36.8 C)     Temp Source 06/27/21 0827 Oral     SpO2 06/27/21 0827 98 %     Weight --      Height --      Head Circumference --      Peak Flow --      Pain Score 06/27/21 0826 5     Pain Loc --      Pain Edu? --      Excl. in GC? --   No data found.  Updated Vital Signs BP 133/80 (BP Location: Right Arm)   Pulse 84   Temp 98.3 F (36.8 C) (Oral)   Resp 18   SpO2 97%   Physical Exam Vitals and nursing note reviewed.  Constitutional:      General: He is not in acute  distress.    Appearance: Normal appearance. He is not ill-appearing.  HENT:     Head: Normocephalic and atraumatic.  Eyes:     General: Lids are normal.        Right eye: No discharge.        Left eye: No discharge.     Extraocular Movements: Extraocular movements intact.     Conjunctiva/sclera: Conjunctivae normal.     Right eye: Right conjunctiva is not injected.     Left eye: Left conjunctiva is not injected.  Neck:     Trachea: Trachea and phonation normal.  Cardiovascular:     Rate and Rhythm: Normal rate and regular rhythm.     Pulses: Normal pulses.     Heart sounds: Normal heart sounds. No murmur heard.   No friction rub. No gallop.  Pulmonary:     Effort: Pulmonary effort is normal. No accessory muscle usage, prolonged expiration or respiratory distress.  Breath sounds: Normal breath sounds. No stridor, decreased air movement or transmitted upper airway sounds. No decreased breath sounds, wheezing, rhonchi or rales.  Chest:     Chest wall: No tenderness.  Breasts:    Breasts are symmetrical.     Right: Normal.     Left: Mass present. No swelling, bleeding, inverted nipple, nipple discharge, skin change or tenderness.       Comments: Smooth border, palpable lesion beneath left nipple is mobile, nontender.  No nipple discharge was appreciated. Genitourinary:    Comments: Pt politely declines GU exam, pt did provide a penile swab for testing.   Musculoskeletal:        General: Normal range of motion.     Cervical back: Normal range of motion and neck supple. Normal range of motion.  Lymphadenopathy:     Cervical: No cervical adenopathy.  Skin:    General: Skin is warm and dry.     Findings: No erythema or rash.  Neurological:     General: No focal deficit present.     Mental Status: He is alert and oriented to person, place, and time.  Psychiatric:        Mood and Affect: Mood normal.        Behavior: Behavior normal.    Visual Acuity Right Eye Distance:    Left Eye Distance:   Bilateral Distance:    Right Eye Near:   Left Eye Near:    Bilateral Near:     UC Couse / Diagnostics / Procedures:    EKG  Radiology No results found.  Procedures Procedures (including critical care time)  UC Diagnoses / Final Clinical Impressions(s)   I have reviewed the triage vital signs and the nursing notes.  Pertinent labs & imaging results that were available during my care of the patient were reviewed by me and considered in my medical decision making (see chart for details).    Final diagnoses:  Screening examination for STD (sexually transmitted disease)  Simple cyst of left breast   Patient was advised to follow-up with primary care regarding the mass beneath his left nipple, patient advised he may require mammography and/or breast ultrasound, possible aspiration but given that the cyst is likely simple in nature the risk of malignancy is very low.  Patient was set up with a primary care appointment during his urgent care visit today.  Patient will be notified of the results of his STD screening once they are complete, treatment will be provided as indicated.  ED Prescriptions   None    PDMP not reviewed this encounter.  Pending results:  Labs Reviewed  CYTOLOGY, (ORAL, ANAL, URETHRAL) ANCILLARY ONLY    Medications Ordered in UC: Medications - No data to display  Disposition Upon Discharge:  Condition: stable for discharge home Home: take medications as prescribed; routine discharge instructions as discussed; follow up as advised.  Patient presented with an acute illness with associated systemic symptoms and significant discomfort requiring urgent management. In my opinion, this is a condition that a prudent lay person (someone who possesses an average knowledge of health and medicine) may potentially expect to result in complications if not addressed urgently such as respiratory distress, impairment of bodily function or dysfunction of  bodily organs.   Routine symptom specific, illness specific and/or disease specific instructions were discussed with the patient and/or caregiver at length.   As such, the patient has been evaluated and assessed, work-up was performed and treatment was provided in alignment with  urgent care protocols and evidence based medicine.  Patient/parent/caregiver has been advised that the patient may require follow up for further testing and treatment if the symptoms continue in spite of treatment, as clinically indicated and appropriate.  Patient/parent/caregiver has been advised to return to the [facility] or PCP if no better; to PCP or the Emergency Department if new signs and symptoms develop, or if the current signs or symptoms continue to change or worsen for further workup, evaluation and treatment as clinically indicated and appropriate  The patient will follow up with their current PCP if and as advised. If the patient does not currently have a PCP we will assist them in obtaining one.   The patient may need specialty follow up if the symptoms continue, in spite of conservative treatment and management, for further workup, evaluation, consultation and treatment as clinically indicated and appropriate.   Patient/parent/caregiver verbalized understanding and agreement of plan as discussed.  All questions were addressed during visit.  Please see discharge instructions below for further details of plan.  Discharge Instructions:   Discharge Instructions      Please discuss next steps with your primary care provider for evaluation and possible treatment of the cyst in your left breast.  The cyst is considered a simple cyst because it is draining clear fluid and has smooth borders.  Simple cysts carry a low risk for malignancy.  The results of your STD screening will be posted to your MyChart account once they are complete.  If there are any abnormal findings, you will be contacted by phone and treatment  will be provided as needed.  Thank you for visiting urgent care today.      This office note has been dictated using Teaching laboratory technician.  Unfortunately, and despite my best efforts, this method of dictation can sometimes lead to occasional typographical or grammatical errors.  I apologize in advance if this occurs.     Theadora Rama Scales, PA-C 02/04/22 1730

## 2022-02-05 LAB — CYTOLOGY, (ORAL, ANAL, URETHRAL) ANCILLARY ONLY
Chlamydia: NEGATIVE
Comment: NEGATIVE
Comment: NEGATIVE
Comment: NORMAL
Neisseria Gonorrhea: NEGATIVE
Trichomonas: NEGATIVE
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
## 2022-02-13 ENCOUNTER — Encounter: Payer: Self-pay | Admitting: Nurse Practitioner

## 2022-02-13 ENCOUNTER — Ambulatory Visit: Payer: BC Managed Care – PPO | Admitting: Nurse Practitioner

## 2022-02-13 VITALS — BP 128/88 | HR 80 | Temp 98.5°F | Ht 70.0 in | Wt 191.0 lb

## 2022-02-13 DIAGNOSIS — Z0000 Encounter for general adult medical examination without abnormal findings: Secondary | ICD-10-CM | POA: Diagnosis not present

## 2022-02-13 DIAGNOSIS — N6342 Unspecified lump in left breast, subareolar: Secondary | ICD-10-CM

## 2022-02-13 DIAGNOSIS — Z13220 Encounter for screening for lipoid disorders: Secondary | ICD-10-CM

## 2022-02-13 DIAGNOSIS — Z1159 Encounter for screening for other viral diseases: Secondary | ICD-10-CM | POA: Diagnosis not present

## 2022-02-13 DIAGNOSIS — Z114 Encounter for screening for human immunodeficiency virus [HIV]: Secondary | ICD-10-CM

## 2022-02-13 DIAGNOSIS — Z8639 Personal history of other endocrine, nutritional and metabolic disease: Secondary | ICD-10-CM | POA: Diagnosis not present

## 2022-02-13 NOTE — Patient Instructions (Signed)
It was great to see you!  We are checking your labs today and will let you know the results via mychart/phone.   I am ordered an ultrasound of your breasts. Call in a few days if they do not call to schedule.   [facility] [address] [place], [address] [place], [place] [phone]  Let's follow-up in based on your lab results, sooner if you have concerns.  If a referral was placed today, you will be contacted for an appointment. Please note that routine referrals can sometimes take up to 3-4 weeks to process. Please call our office if you haven't heard anything after this time frame.  Take care,  Rodman Pickle, NP

## 2022-02-13 NOTE — Assessment & Plan Note (Signed)
Small mass felt to the subareolar region on left breast.  With palpation, clear discharge expressed from nipple.  He has felt similar area on his right breast, no mass palpated, however clear discharge expressed from right nipple as well.  We will order diagnostic mammogram bilateral with possible ultrasound.  Follow-up after testing.

## 2022-02-16 LAB — CBC WITH DIFFERENTIAL/PLATELET
Absolute Monocytes: 826 cells/uL (ref 200–950)
Basophils Absolute: 41 cells/uL (ref 0–200)
Basophils Relative: 0.4 %
Eosinophils Absolute: 347 cells/uL (ref 15–500)
Eosinophils Relative: 3.4 %
HCT: 42.5 % (ref 38.5–50.0)
Hemoglobin: 14.8 g/dL (ref 13.2–17.1)
Lymphs Abs: 2540 cells/uL (ref 850–3900)
MCH: 30.6 pg (ref 27.0–33.0)
MCHC: 34.8 g/dL (ref 32.0–36.0)
MCV: 88 fL (ref 80.0–100.0)
MPV: 9.4 fL (ref 7.5–12.5)
Monocytes Relative: 8.1 %
Neutro Abs: 6446 cells/uL (ref 1500–7800)
Neutrophils Relative %: 63.2 %
Platelets: 315 10*3/uL (ref 140–400)
RBC: 4.83 10*6/uL (ref 4.20–5.80)
RDW: 11.9 % (ref 11.0–15.0)
Total Lymphocyte: 24.9 %
WBC: 10.2 10*3/uL (ref 3.8–10.8)

## 2022-02-16 LAB — COMPREHENSIVE METABOLIC PANEL
AG Ratio: 1.9 (calc) (ref 1.0–2.5)
ALT: 41 U/L (ref 9–46)
AST: 33 U/L (ref 10–40)
Albumin: 4.5 g/dL (ref 3.6–5.1)
Alkaline phosphatase (APISO): 56 U/L (ref 36–130)
BUN/Creatinine Ratio: 15 (calc) (ref 6–22)
BUN: 20 mg/dL (ref 7–25)
CO2: 27 mmol/L (ref 20–32)
Calcium: 9.6 mg/dL (ref 8.6–10.3)
Chloride: 102 mmol/L (ref 98–110)
Creat: 1.34 mg/dL — ABNORMAL HIGH (ref 0.60–1.26)
Globulin: 2.4 g/dL (calc) (ref 1.9–3.7)
Glucose, Bld: 92 mg/dL (ref 65–99)
Potassium: 4.2 mmol/L (ref 3.5–5.3)
Sodium: 138 mmol/L (ref 135–146)
Total Bilirubin: 0.6 mg/dL (ref 0.2–1.2)
Total Protein: 6.9 g/dL (ref 6.1–8.1)

## 2022-02-16 LAB — HIV ANTIBODY (ROUTINE TESTING W REFLEX): HIV 1&2 Ab, 4th Generation: NONREACTIVE

## 2022-02-16 LAB — LIPID PANEL
Cholesterol: 128 mg/dL (ref <200)
HDL: 44 mg/dL (ref >=40)
LDL Cholesterol (Calc): 69 mg/dL (calc)
Non-HDL Cholesterol (Calc): 84 mg/dL (calc) (ref <130)
Total CHOL/HDL Ratio: 2.9 (calc) (ref <5.0)
Triglycerides: 70 mg/dL (ref <150)

## 2022-02-16 LAB — VITAMIN D 25 HYDROXY (VIT D DEFICIENCY, FRACTURES): Vit D, 25-Hydroxy: 47 ng/mL (ref 30–100)

## 2022-02-16 LAB — HEPATITIS C ANTIBODY
Hepatitis C Ab: NONREACTIVE
SIGNAL TO CUT-OFF: 0.06 (ref <1.00)

## 2022-02-16 LAB — VITAMIN B12: Vitamin B-12: 994 pg/mL (ref 200–1100)

## 2022-03-12 ENCOUNTER — Other Ambulatory Visit: Payer: Self-pay | Admitting: Nurse Practitioner

## 2022-03-12 DIAGNOSIS — N6342 Unspecified lump in left breast, subareolar: Secondary | ICD-10-CM

## 2022-03-13 ENCOUNTER — Encounter: Payer: Self-pay | Admitting: Nurse Practitioner

## 2022-03-13 ENCOUNTER — Ambulatory Visit (INDEPENDENT_AMBULATORY_CARE_PROVIDER_SITE_OTHER): Payer: BC Managed Care – PPO | Admitting: Nurse Practitioner

## 2022-03-13 ENCOUNTER — Ambulatory Visit: Payer: BC Managed Care – PPO | Admitting: Family Medicine

## 2022-03-13 VITALS — BP 138/77 | HR 95 | Wt 184.2 lb

## 2022-03-13 DIAGNOSIS — K921 Melena: Secondary | ICD-10-CM

## 2022-03-13 DIAGNOSIS — R1013 Epigastric pain: Secondary | ICD-10-CM | POA: Diagnosis not present

## 2022-03-13 LAB — CBC WITH DIFFERENTIAL/PLATELET
Basophils Absolute: 0.1 10*3/uL (ref 0.0–0.1)
Basophils Relative: 0.9 % (ref 0.0–3.0)
Eosinophils Absolute: 0.4 10*3/uL (ref 0.0–0.7)
Eosinophils Relative: 4.3 % (ref 0.0–5.0)
HCT: 43.2 % (ref 39.0–52.0)
Hemoglobin: 14.9 g/dL (ref 13.0–17.0)
Lymphocytes Relative: 25.2 % (ref 12.0–46.0)
Lymphs Abs: 2.1 10*3/uL (ref 0.7–4.0)
MCHC: 34.6 g/dL (ref 30.0–36.0)
MCV: 90.5 fl (ref 78.0–100.0)
Monocytes Absolute: 0.7 10*3/uL (ref 0.1–1.0)
Monocytes Relative: 8.5 % (ref 3.0–12.0)
Neutro Abs: 5.2 10*3/uL (ref 1.4–7.7)
Neutrophils Relative %: 61.1 % (ref 43.0–77.0)
Platelets: 273 10*3/uL (ref 150.0–400.0)
RBC: 4.77 Mil/uL (ref 4.22–5.81)
RDW: 12.4 % (ref 11.5–15.5)
WBC: 8.5 10*3/uL (ref 4.0–10.5)

## 2022-03-13 LAB — COMPREHENSIVE METABOLIC PANEL
ALT: 33 U/L (ref 0–53)
AST: 30 U/L (ref 0–37)
Albumin: 4.7 g/dL (ref 3.5–5.2)
Alkaline Phosphatase: 59 U/L (ref 39–117)
BUN: 17 mg/dL (ref 6–23)
CO2: 27 mEq/L (ref 19–32)
Calcium: 9.7 mg/dL (ref 8.4–10.5)
Chloride: 103 mEq/L (ref 96–112)
Creatinine, Ser: 1.47 mg/dL (ref 0.40–1.50)
GFR: 60.52 mL/min (ref >60.00)
Glucose, Bld: 88 mg/dL (ref 70–99)
Potassium: 4 mEq/L (ref 3.5–5.1)
Sodium: 138 mEq/L (ref 135–145)
Total Bilirubin: 0.9 mg/dL (ref 0.2–1.2)
Total Protein: 7.6 g/dL (ref 6.0–8.3)

## 2022-03-13 MED ORDER — PANTOPRAZOLE SODIUM 40 MG PO TBEC: 40.0000 mg | DELAYED_RELEASE_TABLET | Freq: Every day | ORAL | 0 refills | Status: AC

## 2022-03-13 MED ORDER — HYOSCYAMINE SULFATE 0.125 MG PO TABS: 0.1250 mg | ORAL_TABLET | ORAL | 0 refills | Status: AC | PRN

## 2022-03-13 NOTE — Patient Instructions (Signed)
It was great to see you!  Start protonix 1 tablet daily for 14 days. You can also take hyoscyamine 4 times a day as needed for stomach cramping. I have placed a referral to GI.  Call to schedule your ultrasound near your nipples  [facility] [address] [place], [address] [place], [place] [phone]   We are checking your labs today and will let you know the results via mychart/phone.    Let's follow-up in 4 weeks, sooner if you have concerns.  If a referral was placed today, you will be contacted for an appointment. Please note that routine referrals can sometimes take up to 3-4 weeks to process. Please call our office if you haven't heard anything after this time frame.  Take care,  Rodman Pickle, NP

## 2022-03-13 NOTE — Progress Notes (Signed)
Acute Office Visit  Subjective:     Patient ID: Ruben Fritz, male    DOB: 1986/07/16, 36 y.o.   MRN: [id number]  Chief Complaint  Patient presents with   GI Problem    Pt c/o stomach cramping w/ blood in stoolx1 wk. No nausea or diarrhea    HPI Patient is in today for abdominal pain and cramping for the last 2-3 days. He has intermittent blood in his stool for the past 2 years. The pain started after eating from a chicken place.   ABDOMINAL PAIN   Duration:days  Onset: gradual Severity: mild Quality: cramping Location:  epigastric  Episode duration: few minutes to a few hours Radiation: no Frequency: intermittent Alleviating factors:  Aggravating factors: Status: fluctuating Treatments attempted: Tums, peptobismol Fever: no Nausea: yes Vomiting: no Weight loss: no Decreased appetite: yes Diarrhea: no Constipation: no Blood in stool: yes Heartburn: no Jaundice: no Rash: no Dysuria/urinary frequency: no Hematuria: no Recurrent NSAID use: no  ROS See pertinent positives and negatives per HPI.    Objective:    BP 138/77   Pulse 95   Wt 184 lb 3.2 oz (83.6 kg)   SpO2 98%   BMI 26.43 kg/m    Physical Exam Vitals and nursing note reviewed.  Constitutional:      Appearance: Normal appearance.  HENT:     Head: Normocephalic.  Eyes:     Conjunctiva/sclera: Conjunctivae normal.  Cardiovascular:     Rate and Rhythm: Normal rate and regular rhythm.     Pulses: Normal pulses.     Heart sounds: Normal heart sounds.  Pulmonary:     Effort: Pulmonary effort is normal.     Breath sounds: Normal breath sounds.  Abdominal:     Palpations: Abdomen is soft.     Tenderness: There is no abdominal tenderness. There is no guarding. Negative signs include Murphy's sign, Rovsing's sign and McBurney's sign.  Musculoskeletal:     Cervical back: Normal range of motion.  Skin:    General: Skin is warm.  Neurological:     General: No focal deficit present.      Mental Status: He is alert and oriented to person, place, and time.  Psychiatric:        Mood and Affect: Mood normal.        Behavior: Behavior normal.        Thought Content: Thought content normal.        Judgment: Judgment normal.       Assessment & Plan:   Problem List Items Addressed This Visit       Other   Epigastric pain - Primary    He has been having epigastric cramping and pain since the last 2 to 3 days.  He states that it started after eating a chicken some out at a restaurant.  No red flags on exam.  He is not having any tenderness.  He states that the pain gets better with eating, and slightly worse when he is hungry.  We will check CMP, CBC today.  We will have him start pantoprazole 40 mg daily for 2 weeks and hyoscyamine 4 times a day as needed for cramping.  Referral placed to GI.      Relevant Orders   Ambulatory referral to Gastroenterology   CBC with Differential/Platelet (Completed)   Comprehensive metabolic panel (Completed)   Blood in stool    He has been having intermittent bright red blood in his stool for the past  several years.  He states that it has been going on for the past 6 days.  He had a CT scan in January 2019 which did not show anything acute to show reason for the blood in stool.  We will place referral to GI.      Relevant Orders   Ambulatory referral to Gastroenterology    Meds ordered this encounter  Medications   pantoprazole (PROTONIX) 40 MG tablet    Sig: Take 1 tablet (40 mg total) by mouth daily.    Dispense:  14 tablet    Refill:  0   hyoscyamine (LEVSIN) 0.125 MG tablet    Sig: Take 1 tablet (0.125 mg total) by mouth every 4 (four) hours as needed.    Dispense:  30 tablet    Refill:  0    Return in about 4 weeks (around 04/10/2022) for epigastric pain.  Gerre Scull, NP

## 2022-03-13 NOTE — Assessment & Plan Note (Signed)
He has been having epigastric cramping and pain since the last 2 to 3 days.  He states that it started after eating a chicken some out at a restaurant.  No red flags on exam.  He is not having any tenderness.  He states that the pain gets better with eating, and slightly worse when he is hungry.  We will check CMP, CBC today.  We will have him start pantoprazole 40 mg daily for 2 weeks and hyoscyamine 4 times a day as needed for cramping.  Referral placed to GI.

## 2022-03-13 NOTE — Assessment & Plan Note (Signed)
He has been having intermittent bright red blood in his stool for the past several years.  He states that it has been going on for the past 6 days.  He had a CT scan in January 2019 which did not show anything acute to show reason for the blood in stool.  We will place referral to GI.

## 2022-04-20 ENCOUNTER — Ambulatory Visit: Payer: BC Managed Care – PPO

## 2022-04-20 ENCOUNTER — Ambulatory Visit
Admission: RE | Admit: 2022-04-20 | Discharge: 2022-04-20 | Disposition: A | Discharge status: Home / Self Care | Payer: BC Managed Care – PPO | Source: Ambulatory Visit | Attending: Nurse Practitioner | Admitting: Nurse Practitioner

## 2022-04-20 DIAGNOSIS — N6342 Unspecified lump in left breast, subareolar: Secondary | ICD-10-CM

## 2022-04-20 DIAGNOSIS — N6452 Nipple discharge: Secondary | ICD-10-CM | POA: Diagnosis not present

## 2022-08-27 ENCOUNTER — Other Ambulatory Visit: Payer: Self-pay

## 2022-08-27 ENCOUNTER — Encounter (HOSPITAL_COMMUNITY): Payer: Self-pay

## 2022-08-27 ENCOUNTER — Emergency Department (HOSPITAL_COMMUNITY)
Admission: EM | Admit: 2022-08-27 | Discharge: 2022-08-27 | Disposition: A | Discharge status: Home / Self Care | Payer: BC Managed Care – PPO | Attending: Emergency Medicine | Admitting: Emergency Medicine

## 2022-08-27 DIAGNOSIS — F32A Depression, unspecified: Secondary | ICD-10-CM | POA: Insufficient documentation

## 2022-08-27 DIAGNOSIS — J45909 Unspecified asthma, uncomplicated: Secondary | ICD-10-CM | POA: Insufficient documentation

## 2022-08-27 DIAGNOSIS — R45851 Suicidal ideations: Secondary | ICD-10-CM

## 2022-08-27 LAB — COMPREHENSIVE METABOLIC PANEL
ALT: 30 U/L (ref 0–44)
AST: 24 U/L (ref 15–41)
Albumin: 4.7 g/dL (ref 3.5–5.0)
Alkaline Phosphatase: 59 U/L (ref 38–126)
Anion gap: 7 (ref 5–15)
BUN: 14 mg/dL (ref 6–20)
CO2: 26 mmol/L (ref 22–32)
Calcium: 9.2 mg/dL (ref 8.9–10.3)
Chloride: 106 mmol/L (ref 98–111)
Creatinine, Ser: 1.35 mg/dL — ABNORMAL HIGH (ref 0.61–1.24)
GFR, Estimated: >60 mL/min (ref >60)
Glucose, Bld: 90 mg/dL (ref 70–99)
Potassium: 4.1 mmol/L (ref 3.5–5.1)
Sodium: 139 mmol/L (ref 135–145)
Total Bilirubin: 1.2 mg/dL (ref 0.3–1.2)
Total Protein: 7.5 g/dL (ref 6.5–8.1)

## 2022-08-27 LAB — CBC
HCT: 44.2 % (ref 39.0–52.0)
Hemoglobin: 15.2 g/dL (ref 13.0–17.0)
MCH: 30.3 pg (ref 26.0–34.0)
MCHC: 34.4 g/dL (ref 30.0–36.0)
MCV: 88 fL (ref 80.0–100.0)
Platelets: 290 10*3/uL (ref 150–400)
RBC: 5.02 MIL/uL (ref 4.22–5.81)
RDW: 11.6 % (ref 11.5–15.5)
WBC: 9.9 10*3/uL (ref 4.0–10.5)
nRBC: 0 % (ref 0.0–0.2)

## 2022-08-27 LAB — ACETAMINOPHEN LEVEL: Acetaminophen (Tylenol), Serum: <10 ug/mL — ABNORMAL LOW (ref 10–30)

## 2022-08-27 LAB — RAPID URINE DRUG SCREEN, HOSP PERFORMED
Amphetamines: NOT DETECTED
Barbiturates: NOT DETECTED
Benzodiazepines: NOT DETECTED
Cocaine: NOT DETECTED
Opiates: NOT DETECTED
Tetrahydrocannabinol: NOT DETECTED

## 2022-08-27 LAB — ETHANOL: Alcohol, Ethyl (B): <10 mg/dL (ref <10)

## 2022-08-27 LAB — SALICYLATE LEVEL: Salicylate Lvl: <7 mg/dL — ABNORMAL LOW (ref 7.0–30.0)

## 2022-08-27 NOTE — ED Notes (Signed)
Pt. Has 2 bags outfit and shoes bags are located in the cabinet listed pt. Belonging 5-8 top shelf

## 2022-08-27 NOTE — ED Provider Notes (Signed)
[facility]-EMERGENCY DEPT Provider Note   CSN: [id number] Arrival date & time: 08/27/22  1246     History  Chief Complaint  Patient presents with   Suicidal    Ruben Fritz is a 36 y.o. male with past medical history of asthma, allergies.  Patient presents ED for evaluation of suicidal ideation.  Patient reports that he recently has been under tremendous amount of stress.  Patient reports that he is currently going through a separation from his wife, his grandmother recently passed.  Patient reports that all of these things occurring around the time of the holidays became too much for him and yesterday he thought to himself that he would like to end his life.  When asked about plan, patient reports that he thought about shooting himself in the head.  The patient denies access to firearms.  Patient denies history of psychiatric hospitalization.  Patient denies history of suicide attempts.  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  Patient reports that he did consume alcohol yesterday however denies any alcohol today.  Patient denies any medical complaints to include chest pain, shortness of breath, nausea vomiting, abdominal pain, back pain.  Patient denies any HI, AVH.  HPI     Home Medications Prior to Admission medications   Medication Sig Start Date End Date Taking? Authorizing Provider  Calcium Carbonate-Vit D-Min (CALCIUM 1200 PO) Calcium    [provider]  hyoscyamine (LEVSIN) 0.125 MG tablet Take 1 tablet (0.125 mg total) by mouth every 4 (four) hours as needed. 03/13/22   McElwee, Jake Church, NP  Multiple Vitamin (MULTIVITAMIN ADULT PO) Multivitamin    [provider]  pantoprazole (PROTONIX) 40 MG tablet Take 1 tablet (40 mg total) by mouth daily. 03/13/22   McElwee, Jake Church, NP      Allergies    Patient has no known allergies.    Review of Systems   Review of Systems   Psychiatric/Behavioral:  Positive for suicidal ideas.   All other systems reviewed and are negative.   Physical Exam Updated Vital Signs BP 131/89   Pulse 66   Temp 98.6 F (37 C) (Oral)   Resp 17   Ht 5\' 10"  (1.778 m)   Wt 72.6 kg   SpO2 100%   BMI 22.96 kg/m  Physical Exam Vitals and nursing note reviewed.  Constitutional:      General: He is not in acute distress.    Appearance: Normal appearance. He is not ill-appearing, toxic-appearing or diaphoretic.  HENT:     Head: Normocephalic and atraumatic.     Nose: Nose normal. No congestion.     Mouth/Throat:     Mouth: Mucous membranes are moist.     Pharynx: Oropharynx is clear.  Eyes:     Extraocular Movements: Extraocular movements intact.     Conjunctiva/sclera: Conjunctivae normal.     Pupils: Pupils are equal, round, and reactive to light.  Cardiovascular:     Rate and Rhythm: Normal rate and regular rhythm.  Pulmonary:     Effort: Pulmonary effort is normal.     Breath sounds: Normal breath sounds. No wheezing.  Abdominal:     General: Abdomen is flat. Bowel sounds are normal.     Palpations: Abdomen is soft.     Tenderness: There is no abdominal tenderness.  Musculoskeletal:     Cervical back: Normal range of motion and neck supple. No tenderness.  Skin:  General: Skin is warm and dry.     Capillary Refill: Capillary refill takes less than 2 seconds.  Neurological:     Mental Status: He is alert and oriented to person, place, and time.     ED Results / Procedures / Treatments   Labs (all labs ordered are listed, but only abnormal results are displayed) Labs Reviewed  COMPREHENSIVE METABOLIC PANEL - Abnormal; Notable for the following components:      Result Value   Creatinine, Ser 1.35 (*)    All other components within normal limits  SALICYLATE LEVEL - Abnormal; Notable for the following components:   Salicylate Lvl [id number] (*)    All other components within normal limits  ACETAMINOPHEN LEVEL -  Abnormal; Notable for the following components:   Acetaminophen (Tylenol), Serum <10 (*)    All other components within normal limits  ETHANOL  CBC  RAPID URINE DRUG SCREEN, HOSP PERFORMED    EKG None  Radiology No results found.  Procedures Procedures   Medications Ordered in ED Medications - No data to display  ED Course/ Medical Decision Making/ A&P                           Medical Decision Making Amount and/or Complexity of Data Reviewed Labs: ordered.   36 year old male presents to ED for evaluation.  Please see HPI for further details.  On examination patient is afebrile nontachycardic.  Patient lung sounds clear bilaterally, has not hypoxic.  Patient abdomen soft and compressible throughout.  Patient neurological examination shows no focal neurodeficits.  Patient is alert and oriented x 3.  Patient endorsing SI, denies HI or AVH.  Patient workup initiated in triage includes acetaminophen, salicylate level, ethanol, CMP, CBC, EKG.  Patient CBC unremarkable.  Patient CMP unremarkable, creatinine at baseline.  Patient acetaminophen and salicylate undetectable.  Patient ethanol undetectable.  Patient EKG nonischemic.  At this time, patient agreeable to staying and being evaluated by TTS.  Patient medically cleared for TTS disposition at this time.  Final Clinical Impression(s) / ED Diagnoses Final diagnoses:  Suicidal ideation    Rx / DC Orders ED Discharge Orders     None         Lawana Chambers 08/27/22 1459    Davonna Belling, MD 08/27/22 [id number]
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
## 2022-08-27 NOTE — Progress Notes (Signed)
Per Ruben Bene, NP pt as been psych cleared. This CSW will now remove pt from the [facility] shift repot. TOC will assist with any discharge needs.  Ruben Fritz, MSW, [facility] 08/27/2022 9:11 PM

## 2022-08-27 NOTE — Discharge Instructions (Signed)
Discharge recommendations:  Please see information for follow-up appointment with psychiatry and therapy. Please follow up with your primary care provider for all medical related needs.   Therapy: We recommend that patient participate in individual therapy to address mental health concerns.  Medications: The patient or guardian is to contact a medical professional and/or outpatient provider to address any new side effects that develop. The patient or guardian should update outpatient providers of any new medications and/or medication changes.   Atypical antipsychotics: If you are prescribed an atypical antipsychotic, it is recommended that your height, weight, BMI, blood pressure, fasting lipid panel, and fasting blood sugar be monitored by your outpatient providers.  Safety:  The patient should abstain from use of illicit substances/drugs and abuse of any medications. If symptoms worsen or do not continue to improve or if the patient becomes actively suicidal or homicidal then it is recommended that the patient return to the closest hospital emergency department, the [facility], or call 911 for further evaluation and treatment. National Suicide Prevention Lifeline 1-800-SUICIDE or 404-557-0367.  About 988 988 offers 24/7 access to trained crisis counselors who can help people experiencing mental health-related distress. People can call or text 988 or chat 988lifeline.org for themselves or if they are worried about a loved one who may need crisis support.  Crisis Mobile: [facility]:                     (203)848-1823 (for crisis response 24 hours a day) [facility] Hotline:                                            (402)261-6463

## 2022-08-27 NOTE — ED Triage Notes (Signed)
Patient states, "It crossed my mind about killing myself and how I would do it, but I don't want to say it."  Patient states he drank whiskey yesterday and not sure how much he drank. Patient states he has not drank in a year until yesterday. Patient denies any drug use.  Patient also denies HI, visual or auditory hallucinations.

## 2022-08-27 NOTE — ED Provider Triage Note (Signed)
Emergency Medicine Provider Triage Evaluation Note  Ruben Fritz , a 36 y.o. male  was evaluated in triage.  Pt complains of SI. He states that last night he drank alcohol and had thoughts of hurting himself with a plan. States that he has been sober for more than a year previously. Denies HI or AVH. Denies any somatic complaints  Review of Systems  Positive:  Negative:   Physical Exam  BP (!) 146/101 (BP Location: Right Arm)   Pulse 73   Temp 98.4 F (36.9 C) (Oral)   Resp 20   Ht 5\' 10"  (1.778 m)   Wt 72.6 kg   SpO2 100%   BMI 22.96 kg/m  Gen:   Awake, no distress   Resp:  Normal effort  MSK:   Moves extremities without difficulty  Other:    Medical Decision Making  Medically screening exam initiated at 1:13 PM.  Appropriate orders placed.  Ruben Fritz was informed that the remainder of the evaluation will be completed by another provider, this initial triage assessment does not replace that evaluation, and the importance of remaining in the ED until their evaluation is complete.     Anastasio Auerbach, PA-C 08/27/22 1328

## 2022-08-27 NOTE — ED Provider Notes (Signed)
Patient has been seen by psychiatry and cleared.  He is not suicidal or homicidal.  He is calm and cooperative. Outpatient resources are given   Glynn Octave, MD 08/27/22 2146

## 2022-08-28 ENCOUNTER — Telehealth: Payer: Self-pay

## 2022-08-28 NOTE — Telephone Encounter (Signed)
Transition Care Management Unsuccessful Follow-up Telephone Call  Date of discharge and from where:  08/27/22 [facility] ED. Dx: Depression  Attempts:  1st Attempt  Reason for unsuccessful TCM follow-up call:  Left voice message

## 2022-09-02 NOTE — Telephone Encounter (Signed)
Transition Care Management Follow-up Telephone Call Date of discharge and from where: 08/27/22 [facility] ED. Dx: Depression  How have you been since you were released from the hospital? I'm doing alright. Any questions or concerns? No  Items Reviewed: Did the pt receive and understand the discharge instructions provided? Yes  Medications obtained and verified? No  Other? No  Any new allergies since your discharge? No  Dietary orders reviewed? No Do you have support at home? Yes    Functional Questionnaire: (I = Independent and D = Dependent) ADLs: I  Bathing/Dressing- I  Meal Prep- I  Eating- I  Maintaining continence- I  Transferring/Ambulation- I  Managing Meds- I  Follow up appointments reviewed:  PCP Hospital f/u appt confirmed? No  Scheduled to see N/A on N/A @ N/A. Pt is not able to schedule at this time due to uncertainty of work schedule and current out[place] location. Pt will call back to schedule appt. [facility] f/u appt confirmed? No  Scheduled to see n/a on n/a @ n/a. Are transportation arrangements needed? No  If their condition worsens, is the pt aware to call PCP or go to the Emergency Dept.? Yes Was the patient provided with contact information for the PCP's office or ED? Yes Was to pt encouraged to call back with questions or concerns? Yes

## 2023-09-25 ENCOUNTER — Emergency Department (HOSPITAL_COMMUNITY)
Admission: EM | Admit: 2023-09-25 | Discharge: 2023-09-25 | Disposition: A | Discharge status: Home / Self Care | Payer: No Typology Code available for payment source | Attending: Emergency Medicine | Admitting: Emergency Medicine

## 2023-09-25 ENCOUNTER — Encounter (HOSPITAL_COMMUNITY): Payer: Self-pay

## 2023-09-25 ENCOUNTER — Emergency Department (HOSPITAL_COMMUNITY): Payer: No Typology Code available for payment source

## 2023-09-25 ENCOUNTER — Other Ambulatory Visit: Payer: Self-pay

## 2023-09-25 DIAGNOSIS — W1839XA Other fall on same level, initial encounter: Secondary | ICD-10-CM | POA: Diagnosis not present

## 2023-09-25 DIAGNOSIS — M25552 Pain in left hip: Secondary | ICD-10-CM | POA: Insufficient documentation

## 2023-09-25 DIAGNOSIS — Y93K1 Activity, walking an animal: Secondary | ICD-10-CM | POA: Diagnosis not present

## 2023-09-25 MED ORDER — CYCLOBENZAPRINE HCL 10 MG PO TABS: 10.0000 mg | ORAL_TABLET | Freq: Two times a day (BID) | ORAL | 0 refills | Status: AC | PRN

## 2023-09-25 MED ORDER — OXYCODONE-ACETAMINOPHEN 5-325 MG PO TABS
1.0000 | ORAL_TABLET | Freq: Once | ORAL | Status: AC
  Administered 2023-09-25: 1 via ORAL
  Filled 2023-09-25: qty 1

## 2023-09-25 MED ORDER — OXYCODONE HCL 5 MG PO TABS: 5.0000 mg | ORAL_TABLET | Freq: Four times a day (QID) | ORAL | 0 refills | Status: AC | PRN

## 2023-09-25 NOTE — ED Provider Notes (Signed)
[facility] AT [facility] Provider Note   CSN: [id number] Arrival date & time: 09/25/23  1047     History  Chief Complaint  Patient presents with   Hip Pain    JOFFRE LUCKS is a 38 y.o. male.  Patient with noncontributory past medical history presents today with complaints of fall.  He states that same occurred immediately prior to arrival today when he was walking his dog and his dog pulled on the leash suddenly and he fell to the ground.  He struck his left hip on the ground and endorses pain to same.  He did not hit his head or lose consciousness.  He is not anticoagulated.  He has not attempted to walk since due to pain.  Denies any other injuries or complaints.  The history is provided by the patient. No language interpreter was used.  Hip Pain       Home Medications Prior to Admission medications   Medication Sig Start Date End Date Taking? Authorizing Provider  cetirizine (ZYRTEC) 10 MG tablet Take 10 mg by mouth at bedtime as needed for allergies or rhinitis (up to three nights a week).    [provider]  hyoscyamine (LEVSIN) 0.125 MG tablet Take 1 tablet (0.125 mg total) by mouth every 4 (four) hours as needed. Patient not taking: Reported on 08/27/2022 03/13/22   Gerre Scull, NP  Multiple Vitamins-Minerals (MENS MULTIVITAMIN) TABS Take 1 tablet by mouth daily with breakfast.    [provider]  pantoprazole (PROTONIX) 40 MG tablet Take 1 tablet (40 mg total) by mouth daily. Patient not taking: Reported on 08/27/2022 03/13/22   Rodman Pickle A, NP  zinc gluconate 50 MG tablet Take 50 mg by mouth daily.    [provider]      Allergies    Patient has no known allergies.    Review of Systems   Review of Systems  Musculoskeletal:  Positive for arthralgias.  All other systems reviewed and are negative.   Physical Exam Updated Vital Signs BP (!) 133/111 (BP Location: Right Arm)   Pulse 62    Temp 98.2 F (36.8 C) (Oral)   Resp 18   Ht 5\' 10"  (1.778 m)   Wt 72.6 kg   SpO2 100%   BMI 22.96 kg/m  Physical Exam Vitals and nursing note reviewed.  Constitutional:      General: He is not in acute distress.    Appearance: Normal appearance. He is normal weight. He is not ill-appearing, toxic-appearing or diaphoretic.  HENT:     Head: Normocephalic and atraumatic.     Comments: No racoon eyes No battle sign Eyes:     Extraocular Movements: Extraocular movements intact.     Pupils: Pupils are equal, round, and reactive to light.  Cardiovascular:     Rate and Rhythm: Normal rate.     Comments: No tenderness to palpation of the anterior chest wall Pulmonary:     Effort: Pulmonary effort is normal. No respiratory distress.  Abdominal:     Comments: No abdominal tenderness or bruising  Musculoskeletal:        General: Normal range of motion.     Cervical back: Normal and normal range of motion.     Thoracic back: Normal.     Lumbar back: Normal.     Comments: No midline tenderness, no stepoffs or deformity noted on palpation of cervical, thoracic, and lumbar spine  TTP noted to the left groin  area without deformity or overlying skin changes.  DP and PT pulses intact and 2+.  No external rotation or shortening. Pain noted with flexion of the hip concerning for quadriceps injury, able to extend fully without issue. Ambulatory with crutches for support.  Skin:    General: Skin is warm and dry.  Neurological:     General: No focal deficit present.     Mental Status: He is alert and oriented to person, place, and time.  Psychiatric:        Mood and Affect: Mood normal.        Behavior: Behavior normal.     ED Results / Procedures / Treatments   Labs (all labs ordered are listed, but only abnormal results are displayed) Labs Reviewed - No data to display  EKG None  Radiology DG Hip Unilat W or Wo Pelvis 2-3 Views Left Result Date: 09/25/2023 CLINICAL DATA:  Fall.   Left hip pain. EXAM: DG HIP (WITH OR WITHOUT PELVIS) 2-3V LEFT COMPARISON:  None Available. FINDINGS: Pelvis is intact with normal and symmetric sacroiliac joints. No acute fracture or dislocation. No aggressive osseous lesion. Visualized sacral arcuate lines are unremarkable. Unremarkable symphysis pubis. Unremarkable hip joint. No radiopaque foreign bodies. IMPRESSION: No acute osseous abnormality of the pelvis or left hip joint. Electronically Signed   By: Jules Schick M.D.   On: 09/25/2023 13:41    Procedures Procedures    Medications Ordered in ED Medications  oxyCODONE-acetaminophen (PERCOCET/ROXICET) 5-325 MG per tablet 1 tablet (1 tablet Oral Given 09/25/23 1126)    ED Course/ Medical Decision Making/ A&P                                 Medical Decision Making Amount and/or Complexity of Data Reviewed Radiology: ordered.  Risk Prescription drug management.   Patient presents today with complaints of fall earlier today.  He is afebrile, nontoxic-appearing, and in no acute distress reassuring vital signs.  Physical exam reveals TTP noted to the left hip/groin area.  No external rotation or shortening.  No deformity.  DP PT pulses intact and 2+.  No significant swelling or bruising.  Does have significant difficulty with flexion of the hip concerning for quadriceps injury. X-ray imaging obtained which has resulted and reveals no acute findings.  I have personally reviewed and interpreted this imaging and agree with radiology interpretation. Upon reassessment, patient feeling better with pain medications. Able to walk with crutches for support. Given concern for underlying injury, referral to orthopedics given. Will also send for oxycodone and Flexeril for additional symptomatic relief.  Patient advised not to drive or operate heavy machinery with taking this medication. RICE and Tylenol/ibuprofen use recommended and discussed as well. Evaluation and diagnostic testing in the emergency  department does not suggest an emergent condition requiring admission or immediate intervention beyond what has been performed at this time.  Plan for discharge with close PCP follow-up.  Patient is understanding and amenable with plan, educated on red flag symptoms that would prompt immediate return.  Patient discharged in stable condition.   This is a shared visit with supervising physician Dr. Lockie Mola who has independently evaluated patient & provided guidance in evaluation/management/disposition, in agreement with care   Final Clinical Impression(s) / ED Diagnoses Final diagnoses:  Left hip pain    Rx / DC Orders ED Discharge Orders          Ordered    oxyCODONE (  ROXICODONE) 5 MG immediate release tablet  Every 6 hours PRN        09/25/23 1440    cyclobenzaprine (FLEXERIL) 10 MG tablet  2 times daily PRN        09/25/23 1440          An After Visit Summary was printed and given to the patient.     Silva Bandy, PA-C 09/25/23 1500    Virgina Norfolk, DO 09/26/23 1411

## 2023-09-25 NOTE — Discharge Instructions (Addendum)
As we discussed, your workup in the ER today was reassuring for acute findings.  X-ray imaging of your hip did not reveal any fracture or dislocation.  You may have a tear in one of the muscle groups of your hip causing your pain specifically potentially your quadriceps muscle.  It is very important that you reach out to orthopedics to schedule a close follow-up with appointment.  I have given you a referral with a number to call.  Please call at your earliest convenience.  In the interim, I recommend that you rest, ice, compress, and elevate the area and take Tylenol/ibuprofen as needed for pain.  Please use acetaminophen (Tylenol) or ibuprofen (Advil, Motrin) for pain.  You may use 800 mg ibuprofen every 6 hours or 1000 mg of acetaminophen every 6 hours.  You may choose to alternate between the two, this would be most effective. Do not exceed 4000 mg of acetaminophen within 24 hours.  Do not exceed 3200 mg ibuprofen within 24 hours.  Additionally, I have given you a prescription for a few doses of oxycodone which is a narcotic pain medication you do to take as prescribed as needed for severe pain only.  Do not drive or operate heavy machinery while taking this medication as it can be sedating.  We have also given you a prescription for Flexeril which is a muscle relaxer you can take as prescribed as needed.  Please be advised this can also make you sleepy.  Return if development of any new or worsening symptoms.

## 2023-09-25 NOTE — Progress Notes (Signed)
Orthopedic Tech Progress Note Patient Details:  Ruben Fritz 12-30-1985 [id number]  Ortho Devices Type of Ortho Device: Crutches Ortho Device/Splint Interventions: Ordered, Application, Adjustment   Post Interventions Patient Tolerated: Fair Instructions Provided: Adjustment of device  Tonye Pearson 09/25/2023, 2:46 PM

## 2023-09-25 NOTE — ED Triage Notes (Addendum)
Pt BIB EMS with reports of left hip pain after a fall while walking his dog. Pt complains of pain to the muscle below the left hip (top of thigh). Pt is unable to put pressure on that left leg.
